# Patient Record
Sex: Female | Born: 1975 | Race: White | Hispanic: No | Marital: Married | Smoking: Never smoker
Health system: Southern US, Community
[De-identification: ages and names within clinical notes are randomized; demographics above are authoritative.]

## PROBLEM LIST (undated history)

## (undated) DIAGNOSIS — K589 Irritable bowel syndrome without diarrhea: Secondary | ICD-10-CM

## (undated) DIAGNOSIS — D8940 Mast cell activation, unspecified: Secondary | ICD-10-CM

## (undated) DIAGNOSIS — K219 Gastro-esophageal reflux disease without esophagitis: Secondary | ICD-10-CM

## (undated) DIAGNOSIS — I10 Essential (primary) hypertension: Secondary | ICD-10-CM

## (undated) HISTORY — PX: CHOLECYSTECTOMY: SHX55

## (undated) HISTORY — PX: TONSILLECTOMY: SUR1361

---

## 2019-09-18 ENCOUNTER — Emergency Department (HOSPITAL_BASED_OUTPATIENT_CLINIC_OR_DEPARTMENT_OTHER): Payer: BC Managed Care – PPO

## 2019-09-18 ENCOUNTER — Other Ambulatory Visit: Payer: Self-pay

## 2019-09-18 ENCOUNTER — Emergency Department (HOSPITAL_BASED_OUTPATIENT_CLINIC_OR_DEPARTMENT_OTHER)
Admission: EM | Admit: 2019-09-18 | Discharge: 2019-09-18 | Disposition: A | Discharge status: Home / Self Care | Payer: BC Managed Care – PPO | Attending: Emergency Medicine | Admitting: Emergency Medicine

## 2019-09-18 ENCOUNTER — Encounter (HOSPITAL_BASED_OUTPATIENT_CLINIC_OR_DEPARTMENT_OTHER): Payer: Self-pay | Admitting: Emergency Medicine

## 2019-09-18 DIAGNOSIS — Y939 Activity, unspecified: Secondary | ICD-10-CM | POA: Insufficient documentation

## 2019-09-18 DIAGNOSIS — S0990XA Unspecified injury of head, initial encounter: Secondary | ICD-10-CM | POA: Diagnosis present

## 2019-09-18 DIAGNOSIS — Z79899 Other long term (current) drug therapy: Secondary | ICD-10-CM | POA: Insufficient documentation

## 2019-09-18 DIAGNOSIS — Z881 Allergy status to other antibiotic agents status: Secondary | ICD-10-CM | POA: Diagnosis not present

## 2019-09-18 DIAGNOSIS — Y929 Unspecified place or not applicable: Secondary | ICD-10-CM | POA: Insufficient documentation

## 2019-09-18 DIAGNOSIS — I10 Essential (primary) hypertension: Secondary | ICD-10-CM | POA: Insufficient documentation

## 2019-09-18 DIAGNOSIS — S060X0A Concussion without loss of consciousness, initial encounter: Secondary | ICD-10-CM | POA: Diagnosis not present

## 2019-09-18 DIAGNOSIS — Z7982 Long term (current) use of aspirin: Secondary | ICD-10-CM | POA: Diagnosis not present

## 2019-09-18 DIAGNOSIS — Z888 Allergy status to other drugs, medicaments and biological substances status: Secondary | ICD-10-CM | POA: Diagnosis not present

## 2019-09-18 DIAGNOSIS — W208XXA Other cause of strike by thrown, projected or falling object, initial encounter: Secondary | ICD-10-CM | POA: Insufficient documentation

## 2019-09-18 DIAGNOSIS — S060X1A Concussion with loss of consciousness of 30 minutes or less, initial encounter: Secondary | ICD-10-CM

## 2019-09-18 DIAGNOSIS — Y990 Civilian activity done for income or pay: Secondary | ICD-10-CM | POA: Diagnosis not present

## 2019-09-18 HISTORY — DX: Mast cell activation, unspecified: D89.40

## 2019-09-18 HISTORY — DX: Irritable bowel syndrome without diarrhea: K58.9

## 2019-09-18 HISTORY — DX: Essential (primary) hypertension: I10

## 2019-09-18 HISTORY — DX: Gastro-esophageal reflux disease without esophagitis: K21.9

## 2019-09-18 NOTE — ED Provider Notes (Signed)
[facility] Provider Note   CSN: [id number] Arrival date & time: 09/18/19  2706     History Chief Complaint  Patient presents with  . Head Injury    Kristy Cole is a 43 y.o. female.  Patient is a 43 year old female with history of mast cell activation syndrome, hypertension, GERD.  She presents today for evaluation of head injury.  Patient states that she was at work when a stool fell from a cabinet and struck her in the head.  This occurred 2 days ago.  She reports "blacking out" initially and persistent headache since.  She had extensive bruising to the left upper forehead which has since dissipated and she now has bruising around her eyes.  She denies any visual disturbances.  She denies any neck pain.  The history is provided by the patient.  Head Injury Location:  Frontal Mechanism of injury: direct blow   Pain details:    Quality:  Aching   Duration:  2 days   Timing:  Constant   Progression:  Worsening Chronicity:  New Relieved by:  Nothing Worsened by:  Nothing      Past Medical History:  Diagnosis Date  . GERD (gastroesophageal reflux disease)   . Hypertension   . IBS (irritable bowel syndrome)   . Mast cell activation syndrome ([place])     There are no problems to display for this patient.   Past Surgical History:  Procedure Laterality Date  . CHOLECYSTECTOMY    . TONSILLECTOMY       OB History   No obstetric history on file.     No family history on file.  Social History   Tobacco Use  . Smoking status: Never Smoker  . Smokeless tobacco: Never Used  Substance Use Topics  . Alcohol use: Yes  . Drug use: Not on file    Home Medications Prior to Admission medications   Medication Sig Start Date End Date Taking? Authorizing Provider  aspirin EC 81 MG tablet Take 81 mg by mouth daily.   Yes [provider]  cetirizine (ZYRTEC) 10 MG tablet Take 10 mg by mouth daily.   Yes [provider]    hydroxychloroquine (PLAQUENIL) 200 MG tablet Take 200 mg by mouth daily.   Yes [provider]  lisinopril (ZESTRIL) 20 MG tablet Take 20 mg by mouth daily.   Yes [provider]  omeprazole (PRILOSEC) 20 MG capsule Take 20 mg by mouth daily.   Yes [provider]    Allergies    Ciprofloxacin, Doxycycline, Lanolin, and Monistat [miconazole]  Review of Systems   Review of Systems  All other systems reviewed and are negative.   Physical Exam Updated Vital Signs BP (!) 136/98 (BP Location: Left Arm)   Pulse 87   Temp 98.3 F (36.8 C) (Oral)   Resp 18   Ht 5\' 7"  (1.702 m)   Wt 113.4 kg   SpO2 100%   BMI 39.16 kg/m   Physical Exam Vitals and nursing note reviewed.  Constitutional:      Appearance: Normal appearance.  HENT:     Head: Normocephalic.     Right Ear: Tympanic membrane normal.     Left Ear: Tympanic membrane normal.     Nose: Nose normal.  Eyes:     Extraocular Movements: Extraocular movements intact.     Pupils: Pupils are equal, round, and reactive to light.     Comments: There are ecchymoses around both upper eyelids  Pulmonary:     Effort: Pulmonary effort is normal.  Musculoskeletal:     Cervical back: Normal range of motion and neck supple.  Neurological:     General: No focal deficit present.     Mental Status: She is alert and oriented to person, place, and time.     Cranial Nerves: No cranial nerve deficit.     Sensory: No sensory deficit.     Motor: No weakness.     Coordination: Coordination normal.     ED Results / Procedures / Treatments   Labs (all labs ordered are listed, but only abnormal results are displayed) Labs Reviewed - No data to display  EKG None  Radiology No results found.  Procedures Procedures (including critical care time)  Medications Ordered in ED Medications - No data to display  ED Course  I have reviewed the triage vital signs and the nursing notes.  Pertinent labs & imaging  results that were available during my care of the patient were reviewed by me and considered in my medical decision making (see chart for details).    MDM Rules/Calculators/A&P  Patient presenting here with complaints of headache.  2 days ago a stool fell off of a cabinet and struck her in the head.  Patient seems to be experiencing postconcussive type symptoms.  She is neurologically intact and head CT is negative.  Patient will be advised to take Tylenol or ibuprofen as needed for headache and follow-up in the next week if not improving.  Final Clinical Impression(s) / ED Diagnoses Final diagnoses:  None    Rx / DC Orders ED Discharge Orders    None       Geoffery Lyons, MD 09/18/19 1121

## 2019-09-18 NOTE — ED Notes (Addendum)
Patient transported to CT 

## 2019-09-18 NOTE — ED Triage Notes (Signed)
She was hit in the head with a stool on Thursday at work. C/o ongoing nausea, pain, and feeling sleepy.

## 2019-09-18 NOTE — Discharge Instructions (Addendum)
Rotate Ibuprofen 600 mg with Tylenol 1000 mg every 4 hours as needed for pain.  Follow-up with primary doctor if symptoms or not improving in the next week.

## 2021-02-04 IMAGING — CT CT HEAD W/O CM
3 series · 16 of 47 positions shown, 19 images · non-contrast
Comparison: None.

CLINICAL DATA: 43-year-old female with continued headache, nausea
and drowsiness following head injury three days ago. Initial
encounter.

EXAM:
CT HEAD WITHOUT CONTRAST
TECHNIQUE: Contiguous axial images were obtained from the base of the skull
through the vertex without intravenous contrast.

[Series 2: head wo · axial · 0.48mm/px · z∈[-168,-24]mm · 10 of 35 slices shown, 13 images]
[im 3/35  brain]
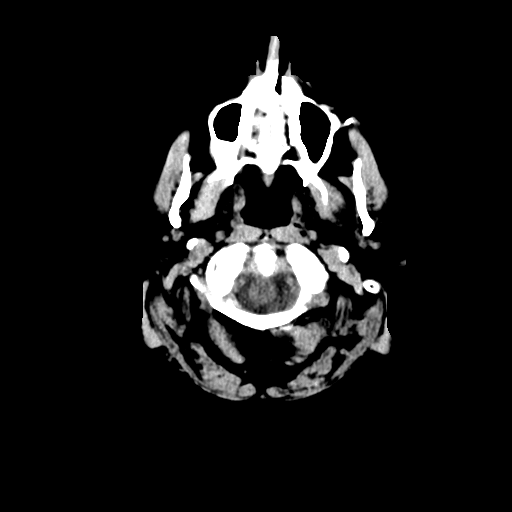
[im 3/35  bone]
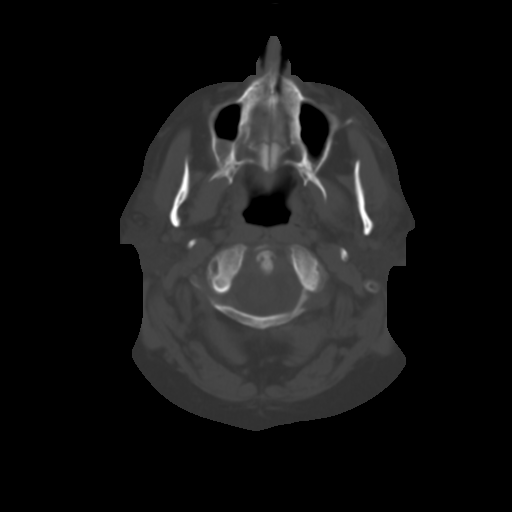
[im 6/35  brain]
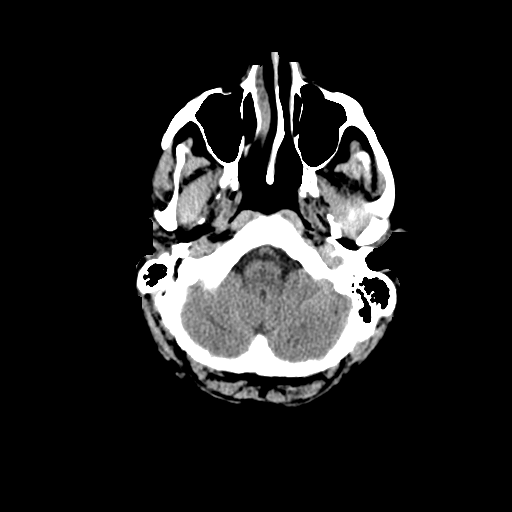
[im 10/35  brain]
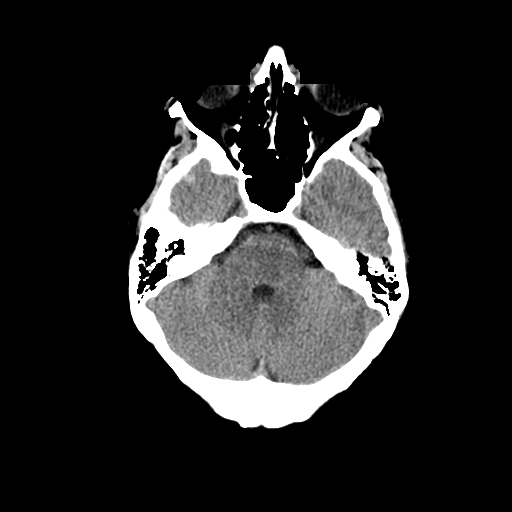
[im 12/35  brain]
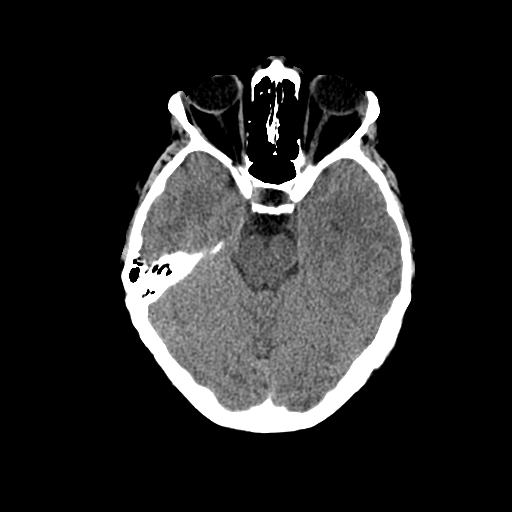
[im 16/35  brain]
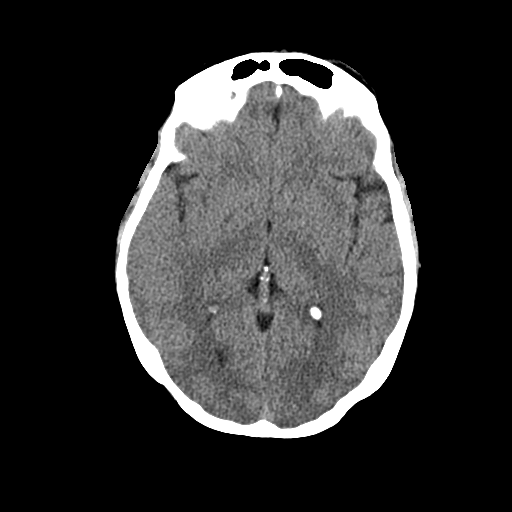
[im 16/35  bone]
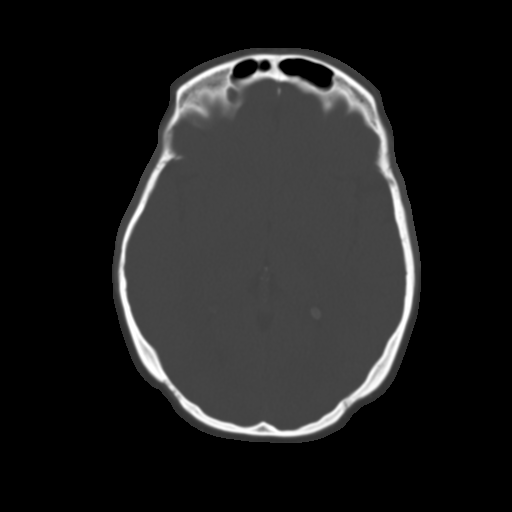
[im 19/35  brain]
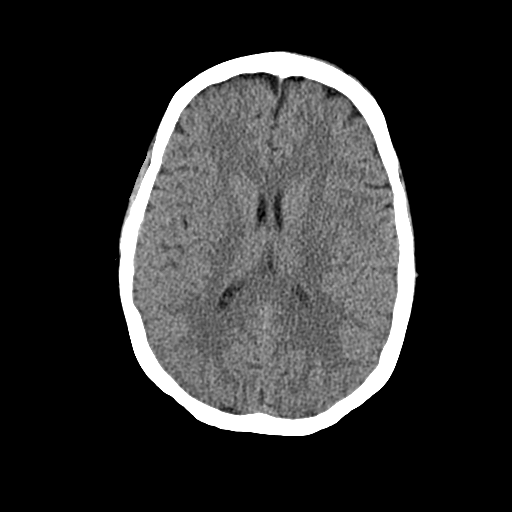
[im 23/35  brain]
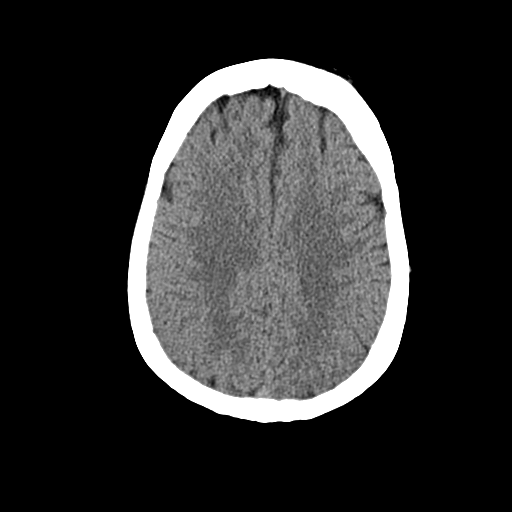
[im 26/35  brain]
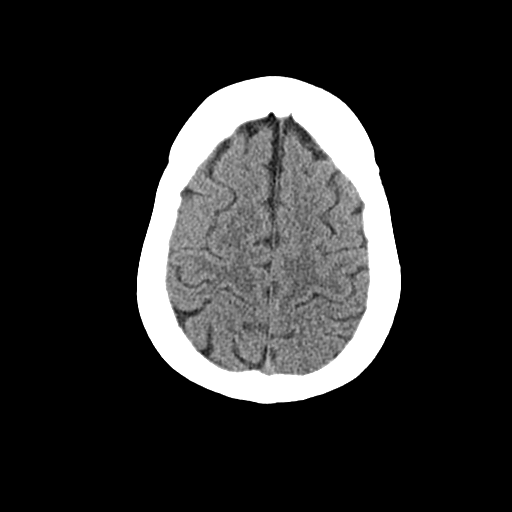
[im 29/35  brain]
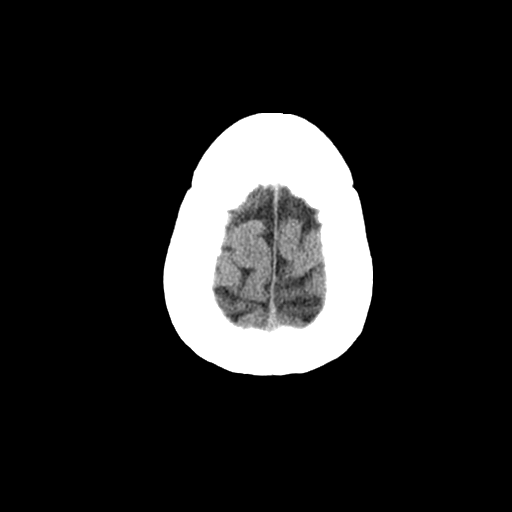
[im 29/35  bone]
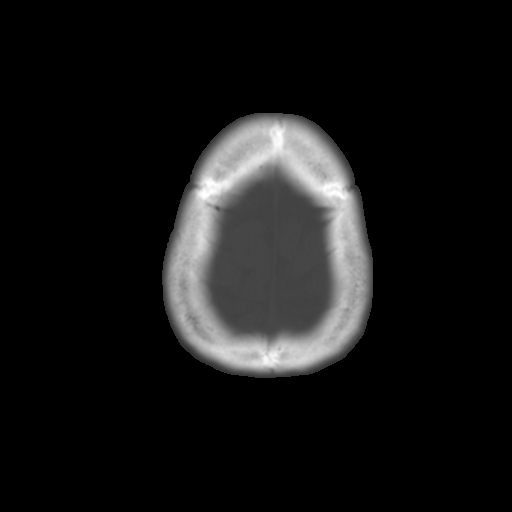
[im 32/35  brain]
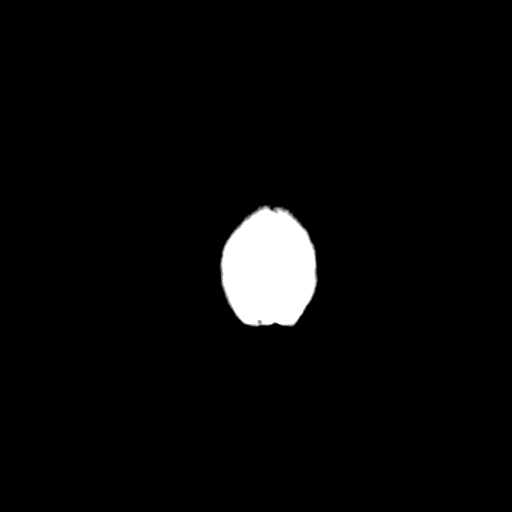

[Series 4: cor soft · coronal · 0.36mm/px · 3 of 73 slices shown]
[im 25/73  brain]
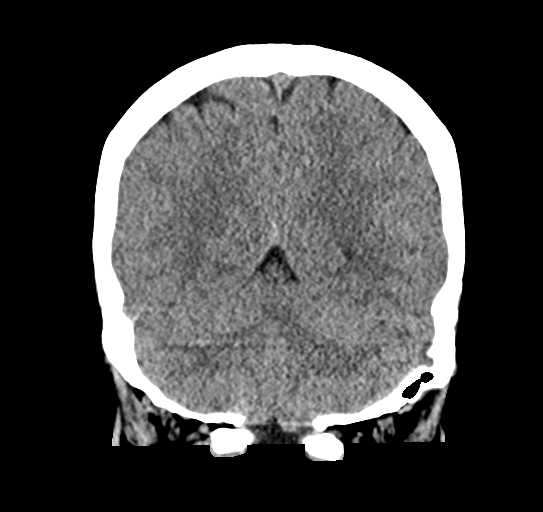
[im 33/73  brain]
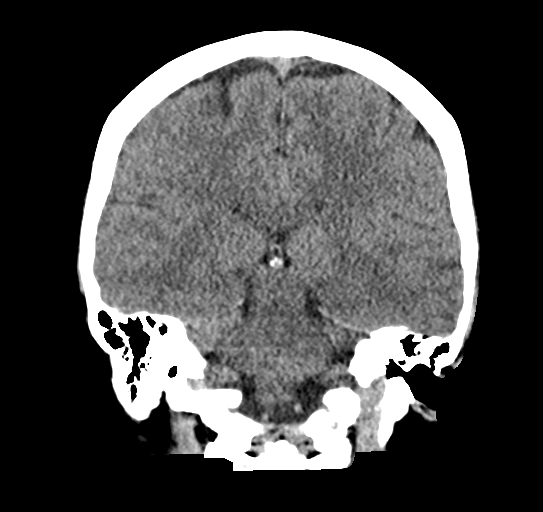
[im 41/73  brain]
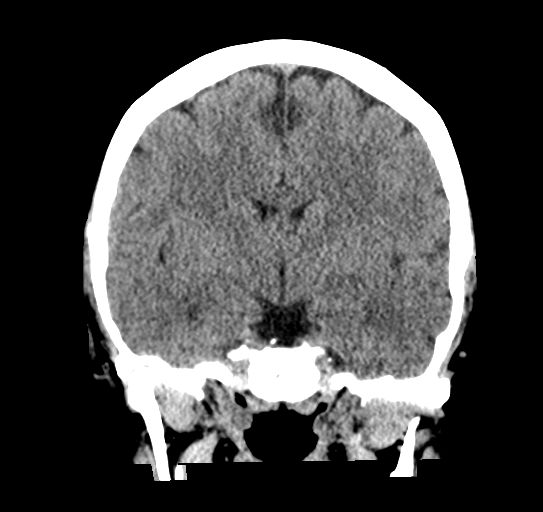

[Series 5: sag soft · sagittal · 0.36mm/px · 3 of 60 slices shown]
[im 20/60  brain]
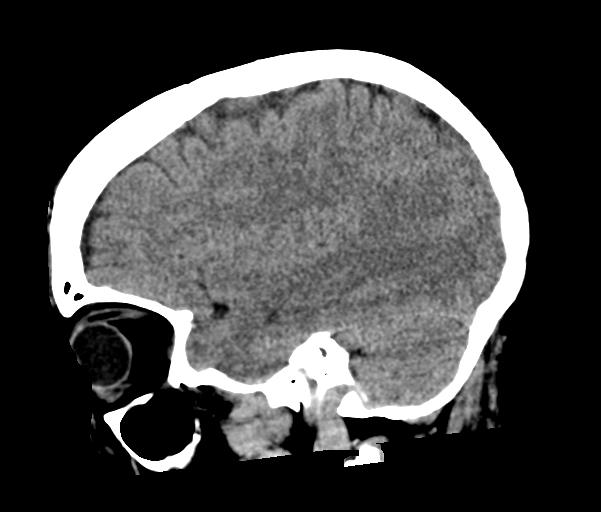
[im 30/60  brain]
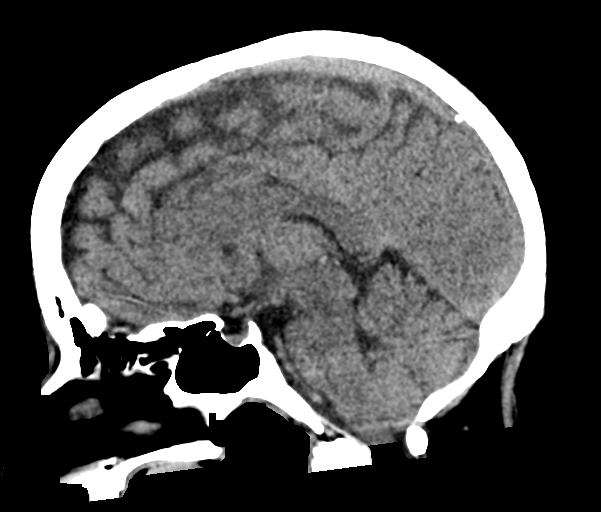
[im 40/60  brain]
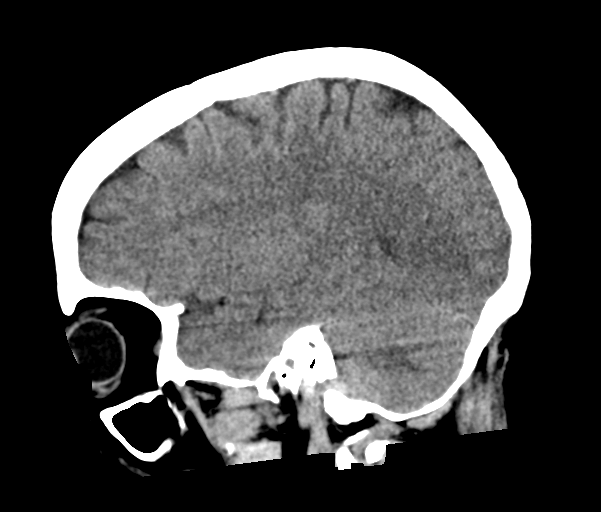

[16 of 47 positions shown; findings below may reference images not displayed]

FINDINGS: Brain: No evidence of infarction, hemorrhage, hydrocephalus,
extra-axial collection or mass lesion/mass effect.

Vascular: No hyperdense vessel or unexpected calcification.

Skull: Normal. Negative for fracture or focal lesion.

Sinuses/Orbits: No acute finding. A small mucous retention
cyst/polyp within the RIGHT maxillary sinus is noted.

Other: None.
IMPRESSION: Unremarkable noncontrast head CT except for small RIGHT maxillary
sinus mucous retention cyst/polyp.
